# Patient Record
Sex: Female | Born: 1970
Health system: Southern US, Community
[De-identification: ages and names within clinical notes are randomized; demographics above are authoritative.]

---

## 2014-03-10 ENCOUNTER — Ambulatory Visit (INDEPENDENT_AMBULATORY_CARE_PROVIDER_SITE_OTHER): Payer: BLUE CROSS/BLUE SHIELD | Admitting: Sports Medicine

## 2014-03-10 ENCOUNTER — Encounter: Payer: Self-pay | Admitting: Sports Medicine

## 2014-03-10 ENCOUNTER — Other Ambulatory Visit: Payer: Self-pay | Admitting: Obstetrics and Gynecology

## 2014-03-10 ENCOUNTER — Ambulatory Visit (INDEPENDENT_AMBULATORY_CARE_PROVIDER_SITE_OTHER): Payer: BLUE CROSS/BLUE SHIELD

## 2014-03-10 VITALS — BP 166/105 | HR 97 | Ht 67.0 in | Wt 236.0 lb

## 2014-03-10 DIAGNOSIS — M5416 Radiculopathy, lumbar region: Secondary | ICD-10-CM | POA: Diagnosis not present

## 2014-03-10 DIAGNOSIS — Z1231 Encounter for screening mammogram for malignant neoplasm of breast: Secondary | ICD-10-CM

## 2014-03-10 DIAGNOSIS — M47816 Spondylosis without myelopathy or radiculopathy, lumbar region: Secondary | ICD-10-CM | POA: Insufficient documentation

## 2014-03-10 DIAGNOSIS — M4316 Spondylolisthesis, lumbar region: Secondary | ICD-10-CM

## 2014-03-10 DIAGNOSIS — M4806 Spinal stenosis, lumbar region: Secondary | ICD-10-CM

## 2014-03-10 MED ORDER — MELOXICAM 15 MG PO TABS: ORAL_TABLET | ORAL | Status: DC

## 2014-03-10 MED ORDER — PREDNISONE 50 MG PO TABS: ORAL_TABLET | ORAL | Status: DC

## 2014-03-10 MED ORDER — CYCLOBENZAPRINE HCL 10 MG PO TABS: ORAL_TABLET | ORAL | Status: DC

## 2014-03-10 NOTE — Progress Notes (Signed)
   Subjective:    I'm seeing this patient as a consultation for:  Dr. 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 It does not radiate to the feet. Pain is moderate, persistent, she is tearful describing her symptoms. Denies any bowel or bladder dysfunction or saddle numbness, no trauma. No constitutional symptoms.  Past medical history, Surgical history, Family history not pertinant except as noted below, Social history, Allergies, and medications have been entered into the medical record, reviewed, and no changes needed.   Review of Systems: No headache, visual changes, nausea, vomiting, diarrhea, constipation, dizziness, abdominal pain, skin rash, fevers, chills, night sweats, weight loss, swollen lymph nodes, body aches, joint swelling, muscle aches, chest pain, shortness of breath, mood changes, visual or auditory hallucinations.   Objective:   General: Well Developed, well nourished, and in no acute distress.  Neuro/Psych: Alert and oriented x3, extra-ocular muscles intact, able to move all 4 extremities, sensation grossly intact. Skin: Warm and dry, no rashes noted.  Respiratory: Not using accessory muscles, speaking in full sentences, trachea midline.  Cardiovascular: Pulses palpable, no extremity edema. Abdomen: Does not appear distended. Back Exam:  Inspection: Unremarkable  Motion: Flexion 45 deg, Extension 45 deg, Side Bending to 45 deg bilaterally,  Rotation to 45 deg bilaterally  SLR laying: Negative  XSLR laying: Negative  Palpable tenderness: None. FABER: negative. Sensory change: Gross sensation intact to all lumbar and sacral dermatomes.  Reflexes: 2+ at both patellar tendons, 2+ at achilles tendons, Babinski's downgoing.  Strength at foot    Plantar-flexion: 5/5 Dorsi-flexion: 5/5 Eversion: 5/5 Inversion: 5/5  Leg strength  Quad: 5/5 Hamstring: 5/5 Hip flexor: 5/5 Hip abductors: 5/5  Gait unremarkable.  X-rays personally reviewed and show L3 on L4 spondylolisthesis at L4 and L5 degenerative disc disease.  Impression and Recommendations:   This case required medical decision making of moderate complexity.

## 2014-03-10 NOTE — Assessment & Plan Note (Signed)
Left-sided L5 distribution. She does have some axial pain as well that is likely facet mediated. Prednisone, switched to meloxicam, Flexeril at bedtime, formal physical therapy, x-rays. Return in one month, MRI for interventional if no better. We also did discuss switching from Lexapro to an SNRI such as Cymbalta or Effexor, she will discuss this with her PCP

## 2014-03-10 NOTE — Patient Instructions (Signed)
We also did discuss switching from Lexapro to an SNRI such as Cymbalta or Effexor

## 2014-03-13 ENCOUNTER — Telehealth: Payer: Self-pay | Admitting: *Deleted

## 2014-03-13 MED ORDER — TRAMADOL HCL 50 MG PO TABS: ORAL_TABLET | ORAL | Status: DC

## 2014-03-13 NOTE — Telephone Encounter (Signed)
Pt left vm stating that the meloxicam & prednisone you gave her Monday aren't really helping & wanted to know if there is anything else she can take for her back discomfort.  Please advise.

## 2014-03-13 NOTE — Telephone Encounter (Signed)
Tramadol in box. 

## 2014-03-28 ENCOUNTER — Ambulatory Visit (INDEPENDENT_AMBULATORY_CARE_PROVIDER_SITE_OTHER): Payer: BLUE CROSS/BLUE SHIELD | Admitting: Physical Therapy

## 2014-03-28 DIAGNOSIS — M2560 Stiffness of unspecified joint, not elsewhere classified: Secondary | ICD-10-CM

## 2014-03-28 DIAGNOSIS — M5416 Radiculopathy, lumbar region: Secondary | ICD-10-CM

## 2014-03-28 DIAGNOSIS — M6281 Muscle weakness (generalized): Secondary | ICD-10-CM

## 2014-03-31 ENCOUNTER — Encounter: Payer: BLUE CROSS/BLUE SHIELD | Admitting: Physical Therapy

## 2014-04-09 ENCOUNTER — Ambulatory Visit (INDEPENDENT_AMBULATORY_CARE_PROVIDER_SITE_OTHER): Payer: BLUE CROSS/BLUE SHIELD

## 2014-04-09 DIAGNOSIS — Z1231 Encounter for screening mammogram for malignant neoplasm of breast: Secondary | ICD-10-CM

## 2014-04-11 ENCOUNTER — Encounter: Payer: Self-pay | Admitting: Sports Medicine

## 2014-04-11 ENCOUNTER — Ambulatory Visit (INDEPENDENT_AMBULATORY_CARE_PROVIDER_SITE_OTHER): Payer: BLUE CROSS/BLUE SHIELD | Admitting: Sports Medicine

## 2014-04-11 ENCOUNTER — Ambulatory Visit (INDEPENDENT_AMBULATORY_CARE_PROVIDER_SITE_OTHER): Payer: BLUE CROSS/BLUE SHIELD | Admitting: Physical Therapy

## 2014-04-11 DIAGNOSIS — M5416 Radiculopathy, lumbar region: Secondary | ICD-10-CM

## 2014-04-11 DIAGNOSIS — M2550 Pain in unspecified joint: Secondary | ICD-10-CM

## 2014-04-11 DIAGNOSIS — M6281 Muscle weakness (generalized): Secondary | ICD-10-CM

## 2014-04-11 MED ORDER — DULOXETINE HCL 30 MG PO CPEP
30.0000 mg | ORAL_CAPSULE | Freq: Every day | ORAL | Status: DC
Start: 2014-04-11 — End: 2014-05-12

## 2014-04-11 MED ORDER — TRAMADOL HCL 50 MG PO TABS: ORAL_TABLET | ORAL | Status: DC

## 2014-04-11 NOTE — Patient Instructions (Addendum)
Quads / HF, Lunge   Kneel in deep lunge, behind leg on floor. Push pelvis down slowly while slightly arching back until stretch is felt on front of hip. Hold _30__ seconds. Repeat _2__ times per session. Do __2_ sessions per day.  Copyright  VHI. All rights reserved.  Supine: Leg Stretch with Strap (Super Advanced)   Lie on back with one leg straight. Hook strap around other foot. Straighten knee. Raise leg to maximal stretch and straighten knee further by tightening quadriceps. Slowly press other leg down as close to floor as possible. Keep lower abdominals tight. Hold _15-30__ seconds. Warning: Intense stretch. Stay within tolerance. Repeat __2_ times per session. Do _1-2__ sessions per day.  Copyright  VHI. All rights reserved.  Piriformis Stretch, Supine   Lie supine, one ankle crossed onto opposite knee. Holding bottom leg behind knee, gently pull legs toward chest until stretch is felt in buttock of top leg. Hold 30___ seconds. For deeper stretch gently push top knee away from body.  Repeat _2__ times per session. Do _2__ sessions per day.  Copyright  VHI. All rights reserved.

## 2014-04-11 NOTE — Assessment & Plan Note (Signed)
50% improvement and axial pain, persistent left L5 radicular pain. She has only had 2 sessions of therapy, I think an additional month of physical therapy, and a switch from Lexapro to Cymbalta will be effective. Discontinue Flexeril, refilling tramadol. Cautioned about signs of serotonin syndrome. Return to see me in a month, MRI for interventional if no better.

## 2014-04-11 NOTE — Therapy (Signed)
[place], [place], [address] Phone: [phone]   Fax:  [phone]  Physical Therapy Treatment  Patient Details  Name: Brenda Church MRN: [id number] Date of Birth: 1971-02-05 Referring Provider:  Silverio Decamp,*  Encounter Date: 04/11/2014      PT End of Session - 04/11/14 0811    Visit Number 2   Number of Visits 6   Date for PT Re-Evaluation 05/09/14   PT Start Time 0808   PT Stop Time 0843   PT Time Calculation (min) 35 min   Activity Tolerance Patient tolerated treatment well      No past medical history on file.  No past surgical history on file.  LMP 03/15/2014  Visit Diagnosis:  Pain, joint, multiple sites - Plan: PT plan of care cert/re-cert  Generalized muscle weakness - Plan: PT plan of care cert/re-cert      Subjective Assessment - 04/11/14 0814    Symptoms Things have gotten 50% better.  Still, by end of day pain down L leg (to ant    Limitations Walking   Currently in Pain? No/denies  by end of day up to 9-10/10. Burning pain down ant L thigh and shin          [facility] Adult PT Treatment/Exercise - 04/11/14 0001    Lumbar Exercises: Stretches   Active Hamstring Stretch Limitations --  Cross body stretch R/L. (Tight lateral R/L thigh)    Hip Flexor Stretch 2 reps;30 seconds  each side kneeling (pillow under knee)   Quad Stretch 2 reps;30 seconds   ITB Stretch --  (Cross body stretch performed)   Piriformis Stretch 2 reps;30 seconds  attempted supine. Pt preferred seated, each side performed.    Lumbar Exercises: Aerobic   Stationary Bike NuStep Level 4 x 5 min    Lumbar Exercises: Supine   Other Supine Lumbar Exercises TA contraction  marching x 15, heel slides x 10 each leg    Knee/Hip Exercises: Sidelying   Clams --  2 x10 each side.            PT Education - 04/11/14 0949    Education provided Yes   Education Details HEP - hip flexor, cross  body stretch, and piriformis stretches    Person(s) Educated Patient   Methods Explanation;Demonstration   Comprehension Verbalized understanding;Returned demonstration           PT Long Term Goals - 04/11/14 0955    PT LONG TERM GOAL #1   Title Demonstrate and/or verbalize techniques to reduce the risk of re-injury to include information on body mechanics and posture.    Time 6   Period Weeks   Status On-going   PT LONG TERM GOAL #2   Title Be independent with advance HEP   Time 6   Period Weeks   Status On-going   PT LONG TERM GOAL #3   Title Report pain decrease to allow her to return to normal ADLs    Time 6   Period Weeks   Status On-going   PT LONG TERM GOAL #4   Title Perform core work with good contraction of TA and multifidus    Time 6   Period Weeks   Status Partially Met   PT LONG TERM GOAL #5   Title Improve FOTO to =/<37% limited   Time 6   Period Weeks   Status On-going   Additional Long Term Goals   Additional Long Term Goals  Yes   PT LONG TERM GOAL #6   Title Work per her previous level   Time 6   Period Weeks   Status On-going           Plan - 04/11/14 0950    Clinical Impression Statement Pt making progress towards goals with reduction in overall pain and improved TA contraction.  LTG # 4 partially met.  Pt tolerated new stretches without increase in symptoms.  Pt very point tender to touch in glute medius and prirformis ; may benefit from  manual therapy next session.  Pt motivated to progress; interested in increasing frequency in order to achieve goals.    PT Frequency 2x / week  Patient initially had requested every other week due to high deductible however now wishes to come more frequently. Will change POC to 2x/wk    PT Duration 6 weeks   PT Treatment/Interventions Cryotherapy;Moist Heat;Traction;Ultrasound;Therapeutic exercise;Patient/family education;Manual techniques;Electrical Stimulation   PT Next Visit Plan Trial traction and  manual therapy next visit. Discusssed with supervising PT regarding therapy frequency.    Consulted and Agree with Plan of Care Patient        Problem List Patient Active Problem List   Diagnosis Date Noted  . Left lumbar radiculopathy 03/10/2014    Kerin Perna, PTA 04/11/2014 10:55 AM  [facility] [place], [place], [address] Phone: [phone]   Fax:  [phone]

## 2014-04-11 NOTE — Progress Notes (Signed)
  Subjective:    CC: Follow-up  HPI: Left lumbar radiculopathy:, Clinical left L5, has only had 2 sessions of physical therapy and notes 50% improvement in her axial pain but persistent left-sided radicular pain. She does desire more physical therapy understandably. She has continued Lexapro and I had asked her to discuss this with her PCP, switching to Cymbalta. She did, and her PCP would like me to do the switch. Flexeril has not been effective, tramadol has been. No bowel or bladder dysfunction or saddle numbness.  Past medical history, Surgical history, Family history not pertinant except as noted below, Social history, Allergies, and medications have been entered into the medical record, reviewed, and no changes needed.   Review of Systems: No fevers, chills, night sweats, weight loss, chest pain, or shortness of breath.   Objective:    General: Well Developed, well nourished, and in no acute distress.  Neuro: Alert and oriented x3, extra-ocular muscles intact, sensation grossly intact.  HEENT: Normocephalic, atraumatic, pupils equal round reactive to light, neck supple, no masses, no lymphadenopathy, thyroid nonpalpable.  Skin: Warm and dry, no rashes. Cardiac: Regular rate and rhythm, no murmurs rubs or gallops, no lower extremity edema.  Respiratory: Clear to auscultation bilaterally. Not using accessory muscles, speaking in full sentences.  Impression and Recommendations:

## 2014-04-11 NOTE — Patient Instructions (Signed)
Cut Lexapro in half for one week then stop, start low-dose Cymbalta tomorrow.

## 2014-04-21 ENCOUNTER — Ambulatory Visit (INDEPENDENT_AMBULATORY_CARE_PROVIDER_SITE_OTHER): Payer: BLUE CROSS/BLUE SHIELD | Admitting: Physical Therapy

## 2014-04-21 DIAGNOSIS — M6281 Muscle weakness (generalized): Secondary | ICD-10-CM

## 2014-04-21 DIAGNOSIS — M2550 Pain in unspecified joint: Secondary | ICD-10-CM

## 2014-04-21 NOTE — Patient Instructions (Signed)
Chair Pose   Stand with legs hip-width apart. Inhaling, bend knees trying to keep knees over ankles, as if to sit on a chair and extend arms in front, wrists slightly lower than shoulders. Hold position for 2 sec.___ breaths. Exhaling, stand up. Repeat __10 each side with ball squeeze then Abd with green band_ times. Do _2__ times per day.  Copyright  VHI. All rights reserved.  Chair Pose   Stand with legs hip-width apart. Inhaling, bend knees trying to keep knees over ankles, as if to sit on a chair and extend arms in front, wrists slightly lower than shoulders. Hold position for ___ breaths. Exhaling, stand up. Repeat ___ times. Do ___ times per day.  Copyright  VHI. All rights reserved.

## 2014-04-21 NOTE — Therapy (Signed)
[place], [place], [address] Phone: [phone]   Fax:  [phone]  Physical Therapy Treatment  Patient Details  Name: Brenda Church MRN: [id number] Date of Birth: 1970/09/03 Referring Provider:  Silverio Decamp,*  Encounter Date: 04/21/2014      PT End of Session - 04/21/14 0749    Visit Number 3   Number of Visits 6   Date for PT Re-Evaluation 05/09/14   PT Start Time 0700   PT Stop Time 3343   PT Time Calculation (min) 55 min   Equipment Utilized During Treatment Other (comment)      No past medical history on file.  No past surgical history on file.  LMP 03/15/2014  Visit Diagnosis:  Generalized muscle weakness  Pain, joint, multiple sites      Subjective Assessment - 04/21/14 0704    Symptoms a little better. overall pain down Lt leg. sharpe.   Currently in Pain? No/denies  never in the mornings                    [facility] Adult PT Treatment/Exercise - 04/21/14 0001    Lumbar Exercises: Stretches   Hip Flexor Stretch 2 reps;30 seconds   Prone on Elbows Stretch 5 reps;10 seconds   Quad Stretch 2 reps;30 seconds   Lumbar Exercises: Standing   Other Standing Lumbar Exercises 10 reps mini lunges  with ball squeeze for pelvic floor/core   Other Standing Lumbar Exercises 10 reps X2  green band around thighs push out   Lumbar Exercises: Prone   Opposite Arm/Leg Raise 10 reps   Opposite Arm/Leg Raise Limitations 10 reps   Modalities   Modalities Traction  pt weighs 240#'s   Traction   Type of Traction Lumbar   Min (lbs) 10#   Max (lbs) 40#  changed from 50# due to pressure in sacrum   Hold Time 60sec   Rest Time 20sec   Time 15 min                PT Education - 04/21/14 0748    Education provided Yes   Person(s) Educated Patient   Methods Explanation;Demonstration;Verbal cues;Handout   Comprehension Verbalized understanding;Returned  demonstration             PT Long Term Goals - 04/11/14 0955    PT LONG TERM GOAL #1   Title Demonstrate and/or verbalize techniques to reduce the risk of re-injury to include information on body mechanics and posture.    Time 6   Period Weeks   Status On-going   PT LONG TERM GOAL #2   Title Be independent with advance HEP   Time 6   Period Weeks   Status On-going   PT LONG TERM GOAL #3   Title Report pain decrease to allow her to return to normal ADLs    Time 6   Period Weeks   Status On-going   PT LONG TERM GOAL #4   Title Perform core work with good contraction of TA and multifidus    Time 6   Period Weeks   Status Partially Met   PT LONG TERM GOAL #5   Title Improve FOTO to =/<37% limited   Time 6   Period Weeks   Status On-going   Additional Long Term Goals   Additional Long Term Goals Yes   PT LONG TERM GOAL #6   Title Work per her previous level   Time 6  Period Weeks   Status On-going               Plan - 04/21/14 1991    PT Next Visit Plan continue with core / spinal stab exercises   PT Home Exercise Plan will continue with her HEP and her own core exercise which are great   Consulted and Agree with Plan of Care Patient        Problem List Patient Active Problem List   Diagnosis Date Noted  . Left lumbar radiculopathy 03/10/2014    Natividad Brood 04/21/2014, 7:57 AM  [facility] [place], [place], [address] Phone: [phone]   Fax:  [phone]

## 2014-04-28 ENCOUNTER — Encounter: Payer: Self-pay | Admitting: Physical Therapy

## 2014-04-28 ENCOUNTER — Ambulatory Visit (INDEPENDENT_AMBULATORY_CARE_PROVIDER_SITE_OTHER): Payer: BLUE CROSS/BLUE SHIELD | Admitting: Physical Therapy

## 2014-04-28 DIAGNOSIS — M6281 Muscle weakness (generalized): Secondary | ICD-10-CM

## 2014-04-28 DIAGNOSIS — M2550 Pain in unspecified joint: Secondary | ICD-10-CM

## 2014-04-28 NOTE — Therapy (Addendum)
[place], [place], [address] Phone: [phone]   Fax:  [phone]  Physical Therapy Treatment  Patient Details  Name: Brenda Church MRN: [id number] Date of Birth: 1970/05/21 Referring Provider:  Silverio Decamp,*  Encounter Date: 04/28/2014      PT End of Session - 04/28/14 0831    Visit Number 4   Number of Visits 6   Date for PT Re-Evaluation 05/09/14   PT Start Time 0759   PT Stop Time 0846   PT Time Calculation (min) 47 min      History reviewed. No pertinent past medical history.  History reviewed. No pertinent past surgical history.  LMP 03/15/2014  Visit Diagnosis:  Generalized muscle weakness  Pain, joint, multiple sites      Subjective Assessment - 04/28/14 0802    Symptoms was able to do some light housework this weekend, happy she was able to do it but sore now. pt felt better after traction                    [facility] Adult PT Treatment/Exercise - 04/28/14 0001    Lumbar Exercises: Stretches   Prone on Elbows Stretch 5 reps;10 seconds   Quad Stretch 2 reps;30 seconds   Piriformis Stretch 2 reps;30 seconds  added anterior tib stretch while she is stretching piriformi   Lumbar Exercises: Aerobic   Stationary Bike nu step level 4 x 5 minutes   Lumbar Exercises: Supine   Bridge 10 reps;Other (comment)  10 reps normal, 10 x with ball squeeze, 10x with alt LE ext   Lumbar Exercises: Prone   Opposite Arm/Leg Raise 10 reps   Lumbar Exercises: Quadruped   Straight Leg Raise 10 reps   Opposite Arm/Leg Raise Right arm/Left leg;Left arm/Right leg;10 reps   Knee/Hip Exercises: Stretches   Gastroc Stretch 2 reps;30 seconds  off of step   Modalities   Modalities Traction   Traction   Type of Traction Lumbar   Min (lbs) 10#   Max (lbs) 40#   Hold Time 60   Rest Time 20   Time 15 min                     PT Long Term Goals - 04/28/14 0102    PT LONG TERM GOAL #1   Title Demonstrate and/or verbalize techniques to reduce the risk of re-injury to include information on body mechanics and posture.    Time 6   Period Weeks   Status On-going   PT LONG TERM GOAL #2   Title Be independent with advance HEP   Time 6   Period Weeks   Status On-going   PT LONG TERM GOAL #3   Title Report pain decrease to allow her to return to normal ADLs    Time 6   Period Weeks   Status On-going   PT LONG TERM GOAL #4   Title Perform core work with good contraction of TA and multifidus    Time 6   Period Weeks   Status Partially Met   PT LONG TERM GOAL #5   Title Improve FOTO to =/<37% limited   Time 6   Period Weeks   Status On-going   PT LONG TERM GOAL #6   Title Work per her previous level   Time 6   Period Weeks   Status On-going  Plan - 04/28/14 1959    Clinical Impression Statement pt does not like squats, added quadruped exercises to continue to strengthen core. pt very compliant with HEP, she likes traction   PT Frequency 2x / week   PT Duration 6 weeks   PT Next Visit Plan continue with core / spinal stab exercises   Consulted and Agree with Plan of Care Patient        Problem List Patient Active Problem List   Diagnosis Date Noted  . Left lumbar radiculopathy 03/10/2014   Isabelle Course, PT, DPT 04/28/2014, 8:36 AM  [facility] [place], [place], [address] Phone: [phone]   Fax:  [phone]     PHYSICAL THERAPY DISCHARGE SUMMARY  Visits from Start of Care: 4  Current functional level related to goals / functional outcomes: Unable to assess as patient did not return for f/u visits.   Remaining deficits: unknown   Education / Equipment: HEP   Plan: Patient agrees to discharge.  Patient goals were partially met. Patient is being discharged due to not returning since the last visit.  ?????       Madelyn Flavors, PT 06/09/2014 9:29 AM  [facility] [place], [place] [address]  [phone] (office) [phone] (fax)

## 2014-05-12 ENCOUNTER — Encounter: Payer: Self-pay | Admitting: Sports Medicine

## 2014-05-12 ENCOUNTER — Ambulatory Visit (INDEPENDENT_AMBULATORY_CARE_PROVIDER_SITE_OTHER): Payer: BLUE CROSS/BLUE SHIELD | Admitting: Sports Medicine

## 2014-05-12 VITALS — BP 160/93 | HR 92 | Ht 67.0 in | Wt 240.0 lb

## 2014-05-12 DIAGNOSIS — M5416 Radiculopathy, lumbar region: Secondary | ICD-10-CM

## 2014-05-12 MED ORDER — GABAPENTIN 300 MG PO CAPS: ORAL_CAPSULE | ORAL | Status: DC

## 2014-05-12 MED ORDER — DULOXETINE HCL 60 MG PO CPEP: 60.0000 mg | ORAL_CAPSULE | Freq: Every day | ORAL | Status: DC

## 2014-05-12 NOTE — Assessment & Plan Note (Signed)
Has now had 4 weeks of physical therapy, back pain is almost gone persistent left L5 radiculopathy. Does not want to proceed to MRI or intervention due to financial reasons. We will add gabapentin and increase Cymbalta to 60 mg. Return in one month.

## 2014-05-12 NOTE — Progress Notes (Signed)
  Subjective:    CC: Follow-up  HPI: French Anaracy returns, she has left-sided L5 radiculopathy, she's done well with formal physical therapy with near complete resolution of her axial back pain but persistent left-sided L5 paresthesias. She has also done well with switching from Celexa to Cymbalta, at this juncture we would typically be proceeding with MRI for interventional planning however due to financial reasons and $11,000 deductible she would like to proceed a bit further with oral medication management before going to MRI and/or interventional injection procedure. Symptoms are mild, improving.  Past medical history, Surgical history, Family history not pertinant except as noted below, Social history, Allergies, and medications have been entered into the medical record, reviewed, and no changes needed.   Review of Systems: No fevers, chills, night sweats, weight loss, chest pain, or shortness of breath.   Objective:    General: Well Developed, well nourished, and in no acute distress.  Neuro: Alert and oriented x3, extra-ocular muscles intact, sensation grossly intact.  HEENT: Normocephalic, atraumatic, pupils equal round reactive to light, neck supple, no masses, no lymphadenopathy, thyroid nonpalpable.  Skin: Warm and dry, no rashes. Cardiac: Regular rate and rhythm, no murmurs rubs or gallops, no lower extremity edema.  Respiratory: Clear to auscultation bilaterally. Not using accessory muscles, speaking in full sentences.  Impression and Recommendations:

## 2014-06-20 ENCOUNTER — Ambulatory Visit: Payer: BLUE CROSS/BLUE SHIELD | Admitting: Sports Medicine

## 2014-06-26 ENCOUNTER — Ambulatory Visit (INDEPENDENT_AMBULATORY_CARE_PROVIDER_SITE_OTHER): Payer: BLUE CROSS/BLUE SHIELD | Admitting: Sports Medicine

## 2014-06-26 ENCOUNTER — Encounter: Payer: Self-pay | Admitting: Sports Medicine

## 2014-06-26 VITALS — BP 158/100 | HR 79 | Ht 67.0 in | Wt 235.0 lb

## 2014-06-26 DIAGNOSIS — M5416 Radiculopathy, lumbar region: Secondary | ICD-10-CM

## 2014-06-26 MED ORDER — TRAMADOL HCL 50 MG PO TABS: ORAL_TABLET | ORAL | Status: DC

## 2014-06-26 NOTE — Progress Notes (Signed)
  Subjective:    CC: follow-up  HPI: Left lumbar radiculopathy: Overall doing well, she is only using Cymbalta 60, ibuprofen 800 at night, Celebrex in the morning,and an occasional tramadol. Happy with results so far, was unable to function on gabapentin,happy with results so far and no further treatment needed.  Past medical history, Surgical history, Family history not pertinant except as noted below, Social history, Allergies, and medications have been entered into the medical record, reviewed, and no changes needed.   Review of Systems: No fevers, chills, night sweats, weight loss, chest pain, or shortness of breath.   Objective:    General: Well Developed, well nourished, and in no acute distress.  Neuro: Alert and oriented x3, extra-ocular muscles intact, sensation grossly intact.  HEENT: Normocephalic, atraumatic, pupils equal round reactive to light, neck supple, no masses, no lymphadenopathy, thyroid nonpalpable.  Skin: Warm and dry, no rashes. Cardiac: Regular rate and rhythm, no murmurs rubs or gallops, no lower extremity edema.  Respiratory: Clear to auscultation bilaterally. Not using accessory muscles, speaking in full sentences.  Impression and Recommendations:

## 2014-06-26 NOTE — Assessment & Plan Note (Signed)
Overall doing significantly better with essential resolution of radiculopathy on Cymbalta 60, occasional tramadol and occasional ibuprofen, she is also doing Celebrex in the mornings. Refilling tramadol, return as needed.

## 2014-08-18 ENCOUNTER — Other Ambulatory Visit: Payer: Self-pay | Admitting: Sports Medicine

## 2015-01-16 ENCOUNTER — Other Ambulatory Visit: Payer: Self-pay | Admitting: Sports Medicine

## 2018-04-06 ENCOUNTER — Encounter: Payer: Self-pay | Admitting: Sports Medicine

## 2018-04-06 ENCOUNTER — Other Ambulatory Visit: Payer: Self-pay | Admitting: Sports Medicine

## 2018-04-06 ENCOUNTER — Ambulatory Visit: Payer: BLUE CROSS/BLUE SHIELD | Admitting: Sports Medicine

## 2018-04-06 ENCOUNTER — Ambulatory Visit (INDEPENDENT_AMBULATORY_CARE_PROVIDER_SITE_OTHER): Payer: BLUE CROSS/BLUE SHIELD

## 2018-04-06 DIAGNOSIS — M5416 Radiculopathy, lumbar region: Secondary | ICD-10-CM

## 2018-04-06 DIAGNOSIS — M5117 Intervertebral disc disorders with radiculopathy, lumbosacral region: Secondary | ICD-10-CM | POA: Diagnosis not present

## 2018-04-06 MED ORDER — PREDNISONE 50 MG PO TABS: ORAL_TABLET | ORAL | 0 refills | Status: DC

## 2018-04-06 MED ORDER — TRAMADOL HCL 50 MG PO TABS: ORAL_TABLET | ORAL | 0 refills | Status: DC

## 2018-04-06 MED ORDER — DULOXETINE HCL 30 MG PO CPEP: 30.0000 mg | ORAL_CAPSULE | Freq: Every day | ORAL | 3 refills | Status: DC

## 2018-04-06 NOTE — Assessment & Plan Note (Signed)
Pain L4 distribution radiculopathy. Adding prednisone, down taper Lexapro and restarting Cymbalta. She will do home rehab exercises. She is having some bowel and bladder dysfunction so we are going to go ahead and proceed with MRI. Return to see me in 3 to 4 weeks.

## 2018-04-06 NOTE — Patient Instructions (Signed)
Decrease Lexapro to 10 mg daily for 1 week then 5 mg daily for 1 week then stop. You may start Cymbalta tomorrow.

## 2018-04-06 NOTE — Progress Notes (Signed)
Subjective:    CC: Low back pain  HPI:  Brenda Church is a pleasant 48 year old female, I last saw her about 4 years ago.  We treated her for left lumbar radiculopathy, she did well with conservative measures and Cymbalta.  Since then she has, Cymbalta, she was placed on Lexapro by her PCP which had good effect for her mood disorder.  Unfortunately over the past year she has had worsening low back pain with radiation down the right leg, to the anterior thigh, to the big toe.  Unfortunately she is also endorsing some bladder dysfunction, with leaking in her pants, mostly during Valsalva.  She does have some weakness of the right leg.  Symptoms are moderate, persistent.  I reviewed the past medical history, family history, social history, surgical history, and allergies today and no changes were needed.  Please see the problem list section below in epic for further details.  Past Medical History: No past medical history on file. Past Surgical History: No past surgical history on file. Social History: Social History   Socioeconomic History  . Marital status: Married    Spouse name: Not on file  . Number of children: Not on file  . Years of education: Not on file  . Highest education level: Not on file  Occupational History  . Not on file  Social Needs  . Financial resource strain: Not on file  . Food insecurity:    Worry: Not on file    Inability: Not on file  . Transportation needs:    Medical: Not on file    Non-medical: Not on file  Tobacco Use  . Smoking status: Never Smoker  . Smokeless tobacco: Never Used  Substance and Sexual Activity  . Alcohol use: Not on file  . Drug use: Not on file  . Sexual activity: Not on file  Lifestyle  . Physical activity:    Days per week: Not on file    Minutes per session: Not on file  . Stress: Not on file  Relationships  . Social connections:    Talks on phone: Not on file    Gets together: Not on file    Attends religious service: Not on  file    Active member of club or organization: Not on file    Attends meetings of clubs or organizations: Not on file    Relationship status: Not on file  Other Topics Concern  . Not on file  Social History Narrative  . Not on file   Family History: No family history on file. Allergies: Allergies  Allergen Reactions  . Vicodin [Hydrocodone-Acetaminophen]    Medications: See med rec.  Review of Systems: No headache, visual changes, nausea, vomiting, diarrhea, constipation, dizziness, abdominal pain, skin rash, fevers, chills, night sweats, swollen lymph nodes, weight loss, chest pain, body aches, joint swelling, muscle aches, shortness of breath, mood changes, visual or auditory hallucinations.  Objective:    General: Well Developed, well nourished, and in no acute distress.  Neuro: Alert and oriented x3, extra-ocular muscles intact, sensation grossly intact.  HEENT: Normocephalic, atraumatic, pupils equal round reactive to light, neck supple, no masses, no lymphadenopathy, thyroid nonpalpable.  Skin: Warm and dry, no rashes noted.  Cardiac: Regular rate and rhythm, no murmurs rubs or gallops.  Respiratory: Clear to auscultation bilaterally. Not using accessory muscles, speaking in full sentences.  Abdominal: Soft, nontender, nondistended, positive bowel sounds, no masses, no organomegaly.  Back Exam:  Inspection: Unremarkable  Motion: Flexion 45 deg, Extension 45  deg, Side Bending to 45 deg bilaterally,  Rotation to 45 deg bilaterally  SLR laying: Negative  XSLR laying: Negative  Palpable tenderness: None. FABER: negative. Sensory change: Gross sensation intact to all lumbar and sacral dermatomes.  Reflexes: 2+ at both patellar tendons, 2+ at achilles tendons, Babinski's downgoing.  Strength at foot  Plantar-flexion: 5/5 Dorsi-flexion: 5/5 Eversion: 5/5 Inversion: 5/5  Leg strength  Quad: 5/5 Hamstring: 5/5 Hip flexor: 5/5 Hip abductors: 5/5  Gait  unremarkable.  Impression and Recommendations:    The patient was counselled, risk factors were discussed, anticipatory guidance given.  Right lumbar radiculopathy Pain L4 distribution radiculopathy. Adding prednisone, down taper Lexapro and restarting Cymbalta. She will do home rehab exercises. She is having some bowel and bladder dysfunction so we are going to go ahead and proceed with MRI. Return to see me in 3 to 4 weeks. ___________________________________________ Ihor Austin. Benjamin Stain, M.D., ABFM., CAQSM. Primary Care and Sports Medicine [facility]  Adjunct Professor of Family Medicine  [employer] [facility] [employer] [facility] [facility] [employer]

## 2018-04-28 ENCOUNTER — Other Ambulatory Visit: Payer: Self-pay | Admitting: Sports Medicine

## 2018-04-28 DIAGNOSIS — M5416 Radiculopathy, lumbar region: Secondary | ICD-10-CM

## 2018-05-07 ENCOUNTER — Ambulatory Visit (INDEPENDENT_AMBULATORY_CARE_PROVIDER_SITE_OTHER): Payer: BLUE CROSS/BLUE SHIELD | Admitting: Sports Medicine

## 2018-05-07 ENCOUNTER — Other Ambulatory Visit: Payer: Self-pay

## 2018-05-07 ENCOUNTER — Encounter: Payer: Self-pay | Admitting: Sports Medicine

## 2018-05-07 DIAGNOSIS — M5416 Radiculopathy, lumbar region: Secondary | ICD-10-CM

## 2018-05-07 MED ORDER — DULOXETINE HCL 60 MG PO CPEP: 60.0000 mg | ORAL_CAPSULE | Freq: Every day | ORAL | 4 refills | Status: DC

## 2018-05-07 NOTE — Assessment & Plan Note (Signed)
Right L4 distribution. Did better with prednisone but now having an increase in pain. I down taper her Lexapro and restarting Cymbalta, increasing this to 60 mg. Never got the MRI. Continue meloxicam, I can refill tramadol as needed. Return to see me in a couple of months.

## 2018-05-07 NOTE — Progress Notes (Signed)
Subjective:    CC: Follow-up  HPI: This is a pleasant 48 year old female, we have been treating her for right L4 radiculopathy.  She did well a long time ago with Cymbalta, she was then switched to Lexapro and her radicular pain came back.  We down taper Lexapro and switched her back to Cymbalta and she has improved to some degree.  She has a bit of aching, meloxicam as well as an occasional Tylenol and tramadol provide some relief.  She is only taking 30 mg of Cymbalta now.  No current bowel or bladder dysfunction, saddle numbness, no constitutional symptoms, no trauma.  Pain is mild, persistent.  I reviewed the past medical history, family history, social history, surgical history, and allergies today and no changes were needed.  Please see the problem list section below in epic for further details.  Past Medical History: No past medical history on file. Past Surgical History: No past surgical history on file. Social History: Social History   Socioeconomic History  . Marital status: Married    Spouse name: Not on file  . Number of children: Not on file  . Years of education: Not on file  . Highest education level: Not on file  Occupational History  . Not on file  Social Needs  . Financial resource strain: Not on file  . Food insecurity:    Worry: Not on file    Inability: Not on file  . Transportation needs:    Medical: Not on file    Non-medical: Not on file  Tobacco Use  . Smoking status: Never Smoker  . Smokeless tobacco: Never Used  Substance and Sexual Activity  . Alcohol use: Not on file  . Drug use: Not on file  . Sexual activity: Not on file  Lifestyle  . Physical activity:    Days per week: Not on file    Minutes per session: Not on file  . Stress: Not on file  Relationships  . Social connections:    Talks on phone: Not on file    Gets together: Not on file    Attends religious service: Not on file    Active member of club or organization: Not on file   Attends meetings of clubs or organizations: Not on file    Relationship status: Not on file  Other Topics Concern  . Not on file  Social History Narrative  . Not on file   Family History: No family history on file. Allergies: Allergies  Allergen Reactions  . Vicodin [Hydrocodone-Acetaminophen]    Medications: See med rec.  Review of Systems: No fevers, chills, night sweats, weight loss, chest pain, or shortness of breath.   Objective:    General: Well Developed, well nourished, and in no acute distress.  Neuro: Alert and oriented x3, extra-ocular muscles intact, sensation grossly intact.  HEENT: Normocephalic, atraumatic, pupils equal round reactive to light, neck supple, no masses, no lymphadenopathy, thyroid nonpalpable.  Skin: Warm and dry, no rashes. Cardiac: Regular rate and rhythm, no murmurs rubs or gallops, no lower extremity edema.  Respiratory: Clear to auscultation bilaterally. Not using accessory muscles, speaking in full sentences.  Impression and Recommendations:    Right lumbar radiculopathy Right L4 distribution. Did better with prednisone but now having an increase in pain. I down taper her Lexapro and restarting Cymbalta, increasing this to 60 mg. Never got the MRI. Continue meloxicam, I can refill tramadol as needed. Return to see me in a couple of months.  I spent  25 minutes with this patient, greater than 50% was face-to-face time counseling regarding the above diagnoses, specifically discussing the diagnostic process, treatment options, and prognosis.  ___________________________________________ Ihor Austin. Benjamin Stain, M.D., ABFM., CAQSM. Primary Care and Sports Medicine [facility]  Adjunct Professor of Family Medicine  [employer] [facility] [employer]

## 2018-05-19 ENCOUNTER — Other Ambulatory Visit: Payer: Self-pay | Admitting: Sports Medicine

## 2018-05-19 DIAGNOSIS — M5416 Radiculopathy, lumbar region: Secondary | ICD-10-CM

## 2018-06-25 ENCOUNTER — Ambulatory Visit: Payer: BLUE CROSS/BLUE SHIELD | Admitting: Sports Medicine

## 2018-06-25 ENCOUNTER — Encounter: Payer: Self-pay | Admitting: Sports Medicine

## 2018-06-25 ENCOUNTER — Ambulatory Visit (INDEPENDENT_AMBULATORY_CARE_PROVIDER_SITE_OTHER): Payer: BLUE CROSS/BLUE SHIELD

## 2018-06-25 ENCOUNTER — Other Ambulatory Visit: Payer: Self-pay

## 2018-06-25 DIAGNOSIS — M5416 Radiculopathy, lumbar region: Secondary | ICD-10-CM

## 2018-06-25 NOTE — Assessment & Plan Note (Addendum)
Persistent right L4 radiculopathy with axial discogenic back pain on the right side. MRI at 11:15 today. This is for interventional planning.  L3-L4 central canal stenosis, multilevel DDD, ordering a right L3-L4 interlaminar epidural, we can target the facets if this does not provide good relief.

## 2018-06-25 NOTE — Addendum Note (Signed)
Addended by: Monica Becton on: 06/25/2018 01:47 PM   Modules accepted: Orders

## 2018-06-25 NOTE — Patient Instructions (Signed)
MRI at 11:15 today.

## 2018-06-25 NOTE — Progress Notes (Addendum)
  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  I reviewed the past medical history, family history, social history, surgical history, and allergies today and no changes were needed.  Please see the problem list section below in epic for further details.  Past Medical History: No past medical history on file. Past Surgical History: No past surgical history on file. Social History: Social History   Socioeconomic History  . Marital status: Married    Spouse name: Not on file  . Number of children: Not on file  . Years of education: Not on file  . Highest education level: Not on file  Occupational History  . Not on file  Social Needs  . Financial resource strain: Not on file  . Food insecurity:    Worry: Not on file    Inability: Not on file  . Transportation needs:    Medical: Not on file    Non-medical: Not on file  Tobacco Use  . Smoking status: Never Smoker  . Smokeless tobacco: Never Used  Substance and Sexual Activity  . Alcohol use: Not on file  . Drug use: Not on file  . Sexual activity: Not on file  Lifestyle  . Physical activity:    Days per week: Not on file    Minutes per session: Not on file  . Stress: Not on file  Relationships  . Social connections:    Talks on phone: Not on file    Gets together: Not on file    Attends religious service: Not on file    Active member of club or organization: Not on file    Attends meetings of clubs or organizations: Not on file    Relationship status: Not on file  Other Topics Concern  . Not on file  Social History Narrative  . Not on file   Family History: No family history on file. Allergies: Allergies  Allergen Reactions  . Vicodin [Hydrocodone-Acetaminophen]    Medications: See med rec.  Review of  Systems: No fevers, chills, night sweats, weight loss, chest pain, or shortness of breath.   Objective:    General: Well Developed, well nourished, and in no acute distress.  Neuro: Alert and oriented x3, extra-ocular muscles intact, sensation grossly intact.  HEENT: Normocephalic, atraumatic, pupils equal round reactive to light, neck supple, no masses, no lymphadenopathy, thyroid nonpalpable.  Skin: Warm and dry, no rashes. Cardiac: Regular rate and rhythm, no murmurs rubs or gallops, no lower extremity edema.  Respiratory: Clear to auscultation bilaterally. Not using accessory muscles, speaking in full sentences.  Impression and Recommendations:    Right lumbar radiculopathy Persistent right L4 radiculopathy with axial discogenic back pain on the right side. MRI at 11:15 today. This is for interventional planning.  L3-L4 central canal stenosis, multilevel DDD, ordering a right L3-L4 interlaminar epidural, we can target the facets if this does not provide good relief.   ___________________________________________ Ihor Austin. Benjamin Stain, M.D., ABFM., CAQSM. Primary Care and Sports Medicine [facility]  Adjunct Professor of Family Medicine  [employer] [facility] [employer]

## 2018-07-23 ENCOUNTER — Encounter: Payer: Self-pay | Admitting: Sports Medicine

## 2018-07-23 ENCOUNTER — Ambulatory Visit: Payer: BLUE CROSS/BLUE SHIELD | Admitting: Sports Medicine

## 2018-07-23 DIAGNOSIS — M47816 Spondylosis without myelopathy or radiculopathy, lumbar region: Secondary | ICD-10-CM

## 2018-07-23 NOTE — Progress Notes (Signed)
Subjective:    CC: Recheck back pain  HPI: Brenda Church returns, she is a pleasant 48 year old female with chronic low back pain, she has L3-L4 spinal stenosis as well as facet degenerative changes, right-sided low back pain with radiation into the right buttock and thigh, she did not respond to a right L3-L4 transforaminal epidural.  I reviewed the past medical history, family history, social history, surgical history, and allergies today and no changes were needed.  Please see the problem list section below in epic for further details.  Past Medical History: No past medical history on file. Past Surgical History: No past surgical history on file. Social History: Social History   Socioeconomic History  . Marital status: Married    Spouse name: Not on file  . Number of children: Not on file  . Years of education: Not on file  . Highest education level: Not on file  Occupational History  . Not on file  Social Needs  . Financial resource strain: Not on file  . Food insecurity:    Worry: Not on file    Inability: Not on file  . Transportation needs:    Medical: Not on file    Non-medical: Not on file  Tobacco Use  . Smoking status: Never Smoker  . Smokeless tobacco: Never Used  Substance and Sexual Activity  . Alcohol use: Not on file  . Drug use: Not on file  . Sexual activity: Not on file  Lifestyle  . Physical activity:    Days per week: Not on file    Minutes per session: Not on file  . Stress: Not on file  Relationships  . Social connections:    Talks on phone: Not on file    Gets together: Not on file    Attends religious service: Not on file    Active member of club or organization: Not on file    Attends meetings of clubs or organizations: Not on file    Relationship status: Not on file  Other Topics Concern  . Not on file  Social History Narrative  . Not on file   Family History: No family history on file.  Health Allergies: Allergies  Allergen Reactions   . Vicodin [Hydrocodone-Acetaminophen]    Medications: See med rec.  Review of Systems: No fevers, chills, night sweats, weight loss, chest pain, or shortness of breath.   Objective:    General: Well Developed, well nourished, and in no acute distress.  Neuro: Alert and oriented x3, extra-ocular muscles intact, sensation grossly intact.  HEENT: Normocephalic, atraumatic, pupils equal round reactive to light, neck supple, no masses, no lymphadenopathy, thyroid nonpalpable.  Skin: Warm and dry, no rashes. Cardiac: Regular rate and rhythm, no murmurs rubs or gallops, no lower extremity edema.  Respiratory: Clear to auscultation bilaterally. Not using accessory muscles, speaking in full sentences.  Impression and Recommendations:    Lumbar spondylosis Right-sided low back pain, she does have L3-L4 central canal stenosis with milder degenerative disc disease at other levels, did not respond to her right L3-L4 epidural, transforaminal approach. Next step will be to target the right L3-L4 and L4-L5 facets, these facets have the greatest degree of osteoarthritis. If this works then we will proceed with medial branch blocks and radiofrequency ablation. She is using tramadol with an antiemetic that only takes a bit of the edge off of the pain.  I spent 25 minutes with this patient, greater than 50% was face-to-face time counseling regarding the above diagnoses.  ___________________________________________  Gwen Her. Dianah Field, M.D., ABFM., CAQSM. Primary Care and Sports Medicine [facility]  Adjunct Professor of [place] [employer] [facility] of Medicine

## 2018-07-23 NOTE — Patient Instructions (Signed)
Right-sided low back pain, she does have L3-L4 central canal stenosis with milder degenerative disc disease at other levels, did not respond to her right L3-L4 epidural, transforaminal approach. Next step will be to target the right L3-L4 and L4-L5 facets, these facets have the greatest degree of osteoarthritis. If this works then we will proceed with medial branch blocks and radiofrequency ablation. She is using tramadol with an antiemetic that only takes a bit of the edge off of the pain.

## 2018-07-23 NOTE — Assessment & Plan Note (Addendum)
Right-sided low back pain, she does have L3-L4 central canal stenosis with milder degenerative disc disease at other levels, did not respond to her right L3-L4 epidural, transforaminal approach. Next step will be to target the right L3-L4 and L4-L5 facets, these facets have the greatest degree of osteoarthritis. If this works then we will proceed with medial branch blocks and radiofrequency ablation. She is using tramadol with an antiemetic that only takes a bit of the edge off of the pain. 

## 2018-08-20 ENCOUNTER — Ambulatory Visit: Payer: BLUE CROSS/BLUE SHIELD | Admitting: Sports Medicine

## 2018-08-23 ENCOUNTER — Other Ambulatory Visit: Payer: Self-pay | Admitting: Sports Medicine

## 2018-12-15 ENCOUNTER — Emergency Department
Admission: EM | Admit: 2018-12-15 | Discharge: 2018-12-15 | Disposition: A | Discharge status: Home / Self Care | Payer: Self-pay | Source: Home / Self Care | Attending: Family Medicine | Admitting: Family Medicine

## 2018-12-15 ENCOUNTER — Other Ambulatory Visit: Payer: Self-pay

## 2018-12-15 DIAGNOSIS — R300 Dysuria: Secondary | ICD-10-CM

## 2018-12-15 LAB — POCT CBC W AUTO DIFF (K'VILLE URGENT CARE)

## 2018-12-15 MED ORDER — SULFAMETHOXAZOLE-TRIMETHOPRIM 800-160 MG PO TABS: 1.0000 | ORAL_TABLET | Freq: Two times a day (BID) | ORAL | 0 refills | Status: AC

## 2018-12-15 NOTE — ED Triage Notes (Signed)
Pt c/o dysuria for over a month. Has been on two different rounds of antibiotics. Saw a urologist. Not able to empty bladder last couple days. Drinking lots of fluids. Taking AZO and cranberry pills prn.

## 2018-12-15 NOTE — ED Provider Notes (Signed)
[facility]    CSN: [id number] Arrival date & time: 12/15/18  0913      History   Chief Complaint Chief Complaint  Patient presents with  . Dysuria  . Nephrolithiasis    HPI Brenda Church is a 48 y.o. female.   Patient complains of dysuria for about three months, and has taken two courses of antibiotics.  She was treated with Macrobid in September, and urine culture done 10/26/18 was negative.  Her dysuria has been worse during the past several days and she has difficulty emptying her bladder.  She denies abdominal pain and fevers, chills, and sweats.  She has a follow-up appointment with her PCP on 12/21/18. Review of records:  X-ray abdomen 10/26/18 ([facility]) showed bilateral non-obstructing renal calculi.  The history is provided by the patient.  Dysuria Pain quality:  Burning Pain severity:  Mild Onset quality:  Gradual Duration:  3 months Timing:  Intermittent Progression:  Worsening Chronicity:  Chronic Recent urinary tract infections: yes   Relieved by:  Nothing Worsened by:  Nothing Ineffective treatments:  Antibiotics and phenazopyridine Urinary symptoms: frequent urination and hesitancy   Urinary symptoms: no discolored urine, no foul-smelling urine, no hematuria and no bladder incontinence   Associated symptoms: no abdominal pain, no fever, no flank pain, no genital lesions, no nausea, no vaginal discharge and no vomiting   Risk factors: recurrent urinary tract infections     History reviewed. No pertinent past medical history.  Patient Active Problem List   Diagnosis Date Noted  . Lumbar spondylosis 03/10/2014    History reviewed. No pertinent surgical history.  OB History   No obstetric history on file.      Home Medications    Prior to Admission medications   Medication Sig Start Date End Date Taking? Authorizing Provider  baclofen (LIORESAL) 10 MG tablet Take by mouth. 12/10/18 12/10/19 Yes [provider]  celecoxib  (CELEBREX) 100 MG capsule Take by mouth. 10/19/18  Yes [provider]  DULoxetine (CYMBALTA) 60 MG capsule TAKE 1 CAPSULE BY MOUTH EVERY DAY 05/20/18  Yes [provider]  HYDROcodone-acetaminophen (NORCO/VICODIN) 5-325 MG tablet Take by mouth. 12/10/18 01/09/19 Yes [provider]  pregabalin (LYRICA) 25 MG capsule Take by mouth. 12/10/18  Yes [provider]  DULoxetine (CYMBALTA) 60 MG capsule TAKE 1 CAPSULE BY MOUTH EVERY DAY 05/20/18   Monica Becton, MD  meloxicam (MOBIC) 15 MG tablet Take by mouth.    [provider]  montelukast (SINGULAIR) 10 MG tablet Take 10 mg by mouth at bedtime.    [provider]  sulfamethoxazole-trimethoprim (BACTRIM DS) 800-160 MG tablet Take 1 tablet by mouth 2 (two) times daily. 12/15/18   Lattie Haw, MD  traMADol (ULTRAM) 50 MG tablet 1-2 TABS BY MOUTH EVERY 8 HOURS, MAXIMUM 6 TABS PER DAY. 08/24/18   Monica Becton, MD    Family History History reviewed. No pertinent family history.  Social History Social History   Tobacco Use  . Smoking status: Never Smoker  . Smokeless tobacco: Never Used  Substance Use Topics  . Alcohol use: Not on file  . Drug use: Not on file     Allergies   Vicodin [hydrocodone-acetaminophen]   Review of Systems Review of Systems  Constitutional: Negative for chills, diaphoresis, fatigue and fever.  Gastrointestinal: Negative for abdominal pain, nausea and vomiting.  Genitourinary: Positive for dysuria, frequency and urgency. Negative for flank pain, genital sores, hematuria, pelvic pain and vaginal discharge.  All  other systems reviewed and are negative.    Physical Exam Triage Vital Signs ED Triage Vitals  Enc Vitals Group     BP 12/15/18 0940 (!) 175/114     Pulse Rate 12/15/18 0940 75     Resp 12/15/18 0940 18     Temp 12/15/18 0940 98.5 F (36.9 C)     Temp Source 12/15/18 0940 Oral     SpO2 12/15/18 0940 96 %     Weight 12/15/18  0941 232 lb (105.2 kg)     Height 12/15/18 0941 5\' 7"  (1.702 m)     Head Circumference --      Peak Flow --      Pain Score 12/15/18 0941 5     Pain Loc --      Pain Edu? --      Excl. in [place]? --    No data found.  Updated Vital Signs BP (!) 165/102 (BP Location: Right Arm)   Pulse 75   Temp 98.5 F (36.9 C) (Oral)   Resp 18   Ht 5\' 7"  (1.702 m)   Wt 105.2 kg   LMP  (LMP Unknown)   SpO2 96%   BMI 36.34 kg/m   Visual Acuity Right Eye Distance:   Left Eye Distance:   Bilateral Distance:    Right Eye Near:   Left Eye Near:    Bilateral Near:     Physical Exam Nursing notes and Vital Signs reviewed. Appearance:  Patient appears stated age, and in no acute distress.    Eyes:  Pupils are equal, round, and reactive to light and accomodation.  Extraocular movement is intact.  Conjunctivae are not inflamed   Pharynx:  Normal; moist mucous membranes  Neck:  Supple.  No adenopathy Lungs:  Clear to auscultation.  Breath sounds are equal.  Moving air well. Heart:  Regular rate and rhythm without murmurs, rubs, or gallops.  Abdomen:  Nontender without masses or hepatosplenomegaly.  Bowel sounds are present.  No CVA or flank tenderness.  Extremities:  No edema.  Skin:  No rash present.     UC Treatments / Results  Labs (all labs ordered are listed, but only abnormal results are displayed) Labs Reviewed  URINE CULTURE  POCT CBC W AUTO DIFF ([place]):  WBC 10.8; LY 32.2; MO 5.0; GR 62.8; Hgb 14.1; Platelets 322     EKG   Radiology No results found.  Procedures Procedures (including critical care time)  Medications Ordered in UC Medications - No data to display  Initial Impression / Assessment and Plan / UC Course  I have reviewed the triage vital signs and the nursing notes.  Pertinent labs & imaging results that were available during my care of the patient were reviewed by me and considered in my medical decision making (see chart for details).     Note mild leukocytosis (WBC 10.8) Urine culture pending.  Begin Bactrim DS BID. Followup with Family Doctor as scheduled in about 6 days. Recommend evaluation by urologist   Final Clinical Impressions(s) / UC Diagnoses   Final diagnoses:  Dysuria     Discharge Instructions     Increase fluid intake. May use non-prescription AZO for about two days, if desired, to decrease urinary discomfort.   If symptoms become significantly worse during the night or over the weekend, proceed to the local emergency room.     ED Prescriptions    Medication Sig Dispense Auth. Provider   sulfamethoxazole-trimethoprim (BACTRIM DS) 800-160 MG  tablet Take 1 tablet by mouth 2 (two) times daily. 20 tablet Lattie HawBeese, Ennis Delpozo A, MD        Lattie HawBeese, Haston Casebolt A, MD 12/17/18 1058

## 2018-12-15 NOTE — Discharge Instructions (Addendum)
Increase fluid intake. May use non-prescription AZO for about two days, if desired, to decrease urinary discomfort.  If symptoms become significantly worse during the night or over the weekend, proceed to the local emergency room.  

## 2018-12-16 LAB — URINE CULTURE
MICRO NUMBER:: 1027885
Result:: NO GROWTH
SPECIMEN QUALITY:: ADEQUATE

## 2018-12-20 ENCOUNTER — Other Ambulatory Visit: Payer: Self-pay | Admitting: Sports Medicine

## 2018-12-20 DIAGNOSIS — M5416 Radiculopathy, lumbar region: Secondary | ICD-10-CM

## 2018-12-20 MED ORDER — DULOXETINE HCL 60 MG PO CPEP: ORAL_CAPSULE | ORAL | 1 refills | Status: AC

## 2020-12-06 IMAGING — MR MRI LUMBAR SPINE WITHOUT CONTRAST
4 of 5 series · 26 of 48 positions shown · non-contrast
Comparison: Plain films lumbar spine 04/06/2018 and 03/10/2014.

CLINICAL DATA: Low back pain for 3-5 years which has worsened over
the past 2 months. Bilateral lower extremity spasms. Right L4
radiculopathy. No known injury.

EXAM:
MRI LUMBAR SPINE WITHOUT CONTRAST
TECHNIQUE: Multiplanar, multisequence MR imaging of the lumbar spine was
performed. No intravenous contrast was administered.

[Series 6: T2 · sagittal · 4.0mm · 0.81mm/px · 6 of 15 slices shown (1 of 2)]
[im 1/15]
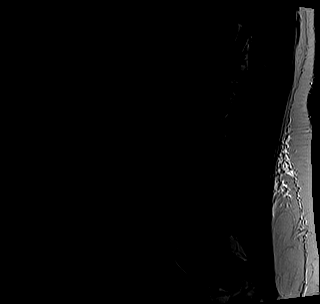
[im 3/15]
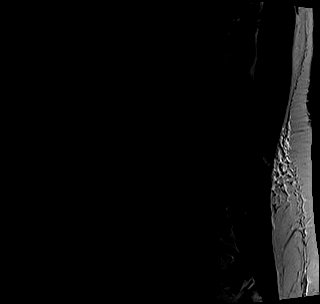
[im 6/15]
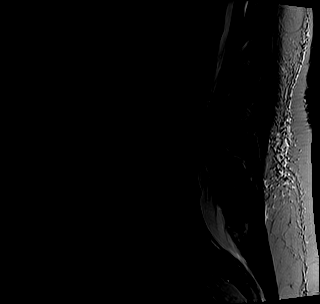
[im 9/15]
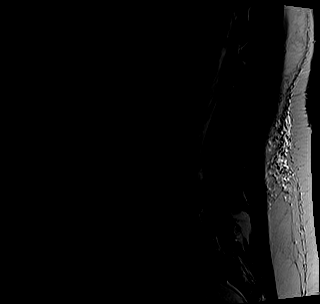
[im 12/15]
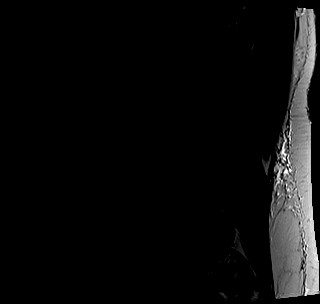
[im 15/15]
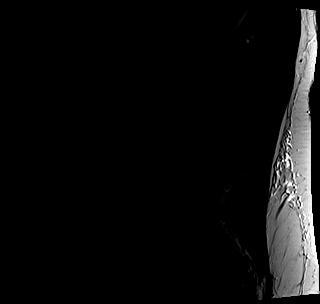

[Series 7: T1 · sagittal · 4.0mm · 0.41mm/px · 6 of 15 slices shown (1 of 2)]
[im 1/15]
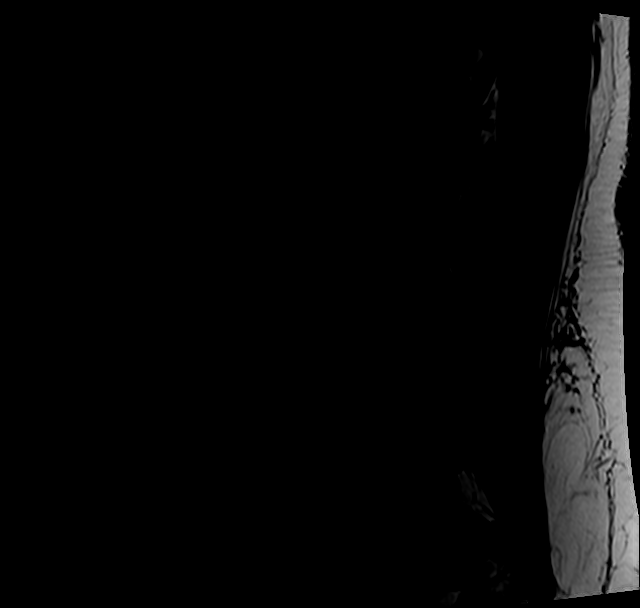
[im 3/15]
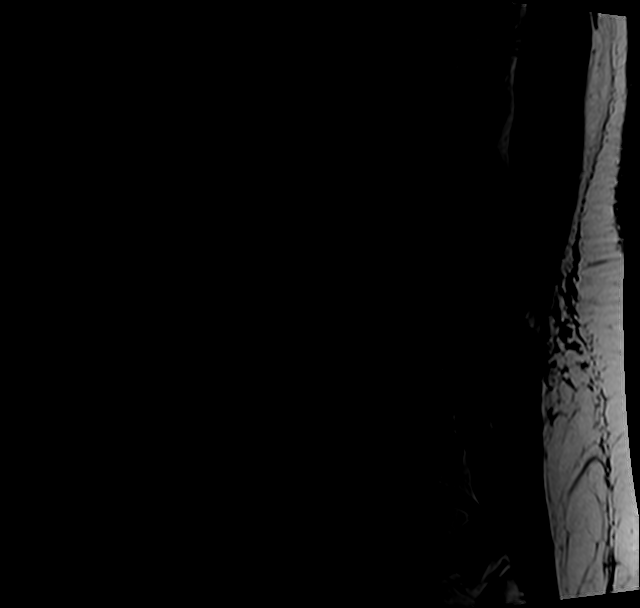
[im 6/15]
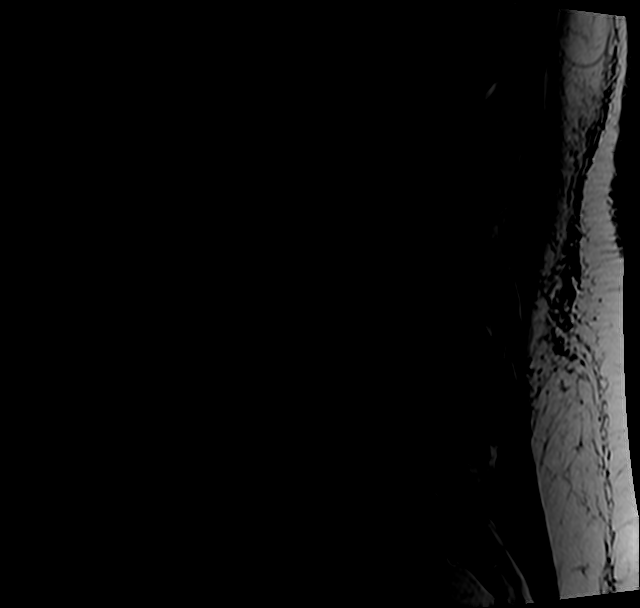
[im 9/15]
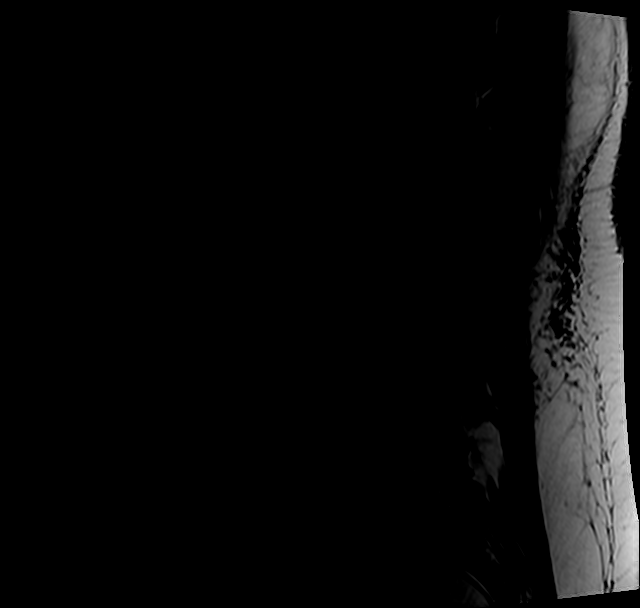
[im 12/15]
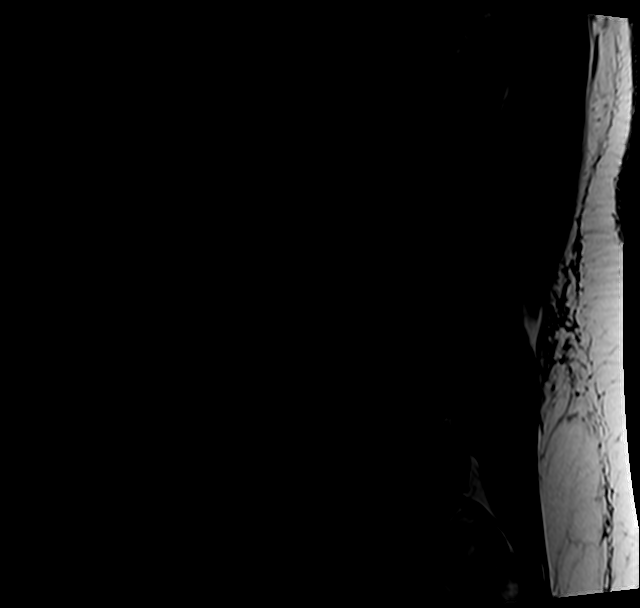
[im 15/15]
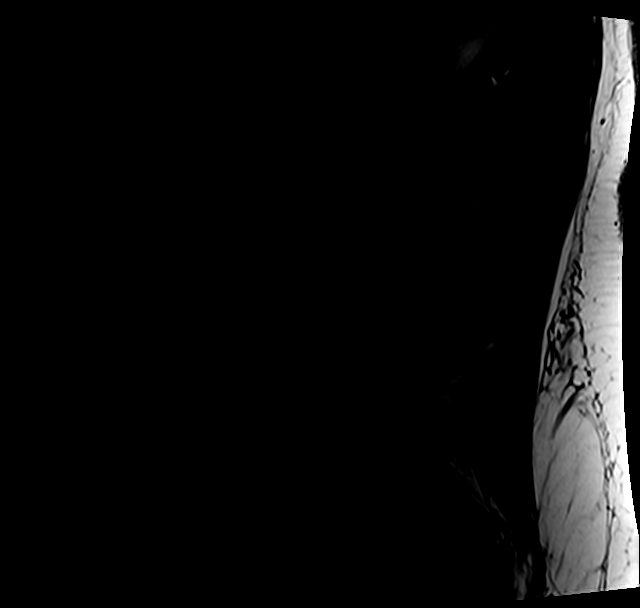

[Series 9: T2 · axial · 4.0mm · 0.78mm/px · z∈[-48,+172]mm · 9 of 35 slices shown (2 of 2)]
[im 1/35]
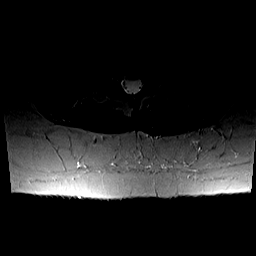
[im 5/35]
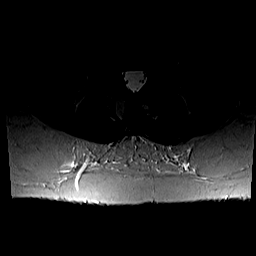
[im 10/35]
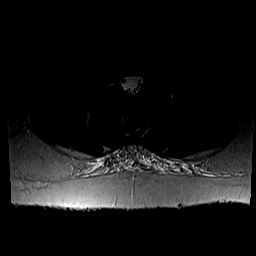
[im 15/35]
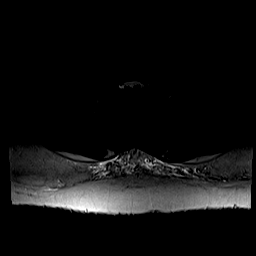
[im 18/35]
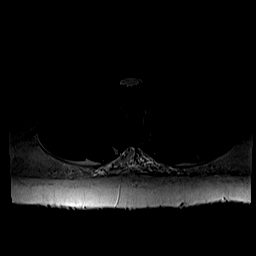
[im 20/35]
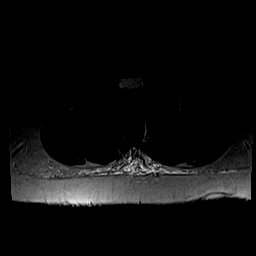
[im 25/35]
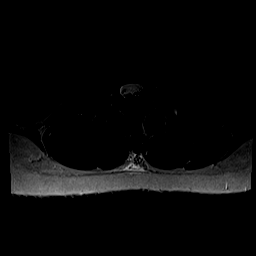
[im 30/35]
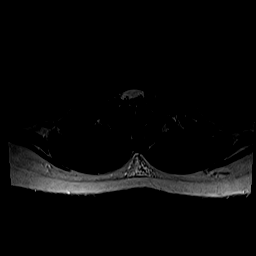
[im 35/35]
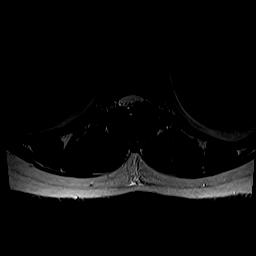

[Series 10: T1 · axial · 4.0mm · 0.39mm/px · z∈[-48,+121]mm · 5 of 35 slices shown (2 of 2)]
[im 1/35]
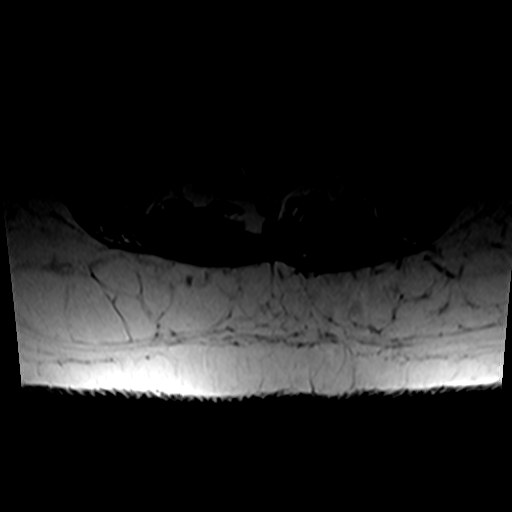
[im 5/35]
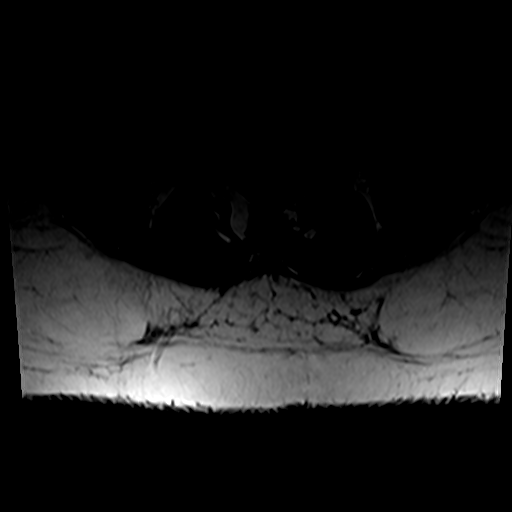
[im 10/35]
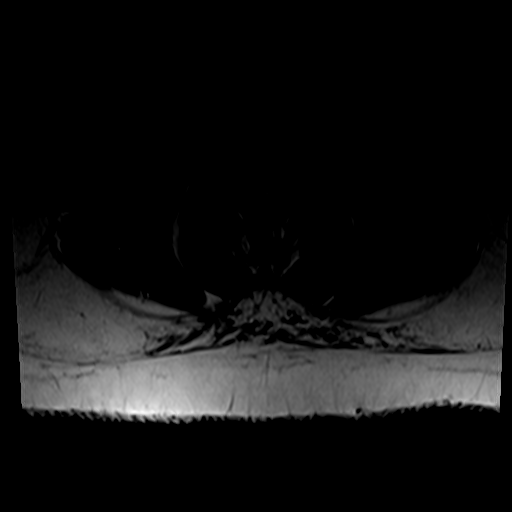
[im 18/35]
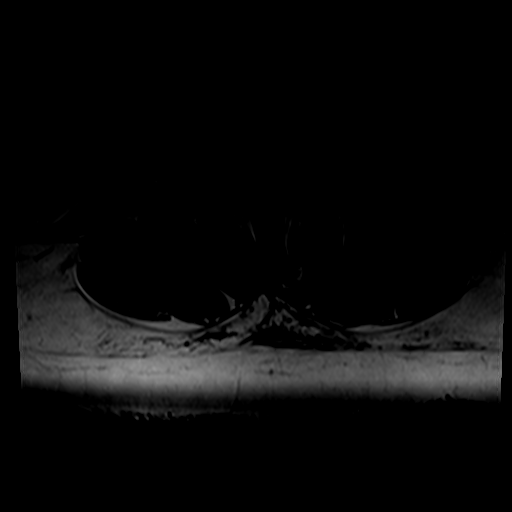
[im 30/35]
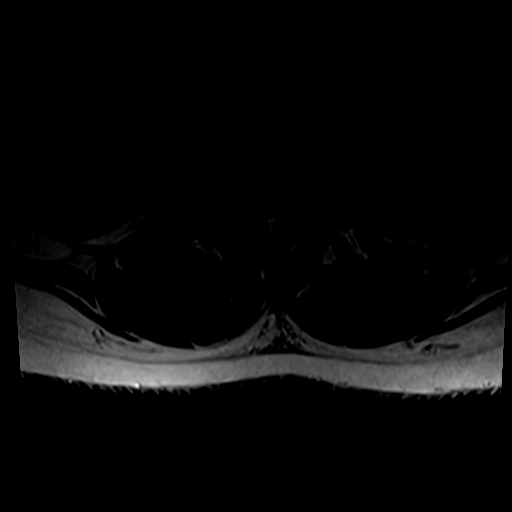

[26 of 48 positions shown; findings below may reference images not displayed]

FINDINGS: Segmentation:  Standard.

Alignment: 0.2 cm facet mediated anterolisthesis L3 on L4 and trace
retrolisthesis L4 on L5 are unchanged.

Vertebrae: No fracture or worrisome lesion. There is some
degenerative endplate signal change at L5-S1 eccentric to the right.

Conus medullaris and cauda equina: Conus extends to the T12 level.
Conus and cauda equina appear normal.

Paraspinal and other soft tissues: Negative.

Disc levels:

T12-L1: Negative.

L1-2: Negative.

L2-3: Shallow disc bulge without central canal narrowing. Mild
bilateral foraminal narrowing noted.

L3-4: Bilateral facet degenerative disease and ligamentum flavum
thickening. The disc is uncovered with a shallow bulge. There is
moderate to moderately severe central canal stenosis and narrowing
in the subarticular recesses, worse on the left. Mild to moderate
bilateral foraminal narrowing is present.

L4-5: There is some facet degenerative disease and a shallow disc
bulge. The central canal is open. Mild bilateral foraminal narrowing
is noted.

L5-S1: Mild bilateral facet degenerative disease. There is a shallow
disc bulge and annular fissure. The central canal and left foramen
are open. Right paravertebral disc and endplate spur contact the
exited right L5 root without compression or displacement.
IMPRESSION: Spondylosis appears worst at L3-4 where facet degenerative disease
results in 0.2 cm anterolisthesis. There is moderate to moderately
severe central canal stenosis at this level with left greater than
right subarticular recess narrowing.

Right paravertebral endplate spur at L5-S1 contacts the exited right
L5 root without compression or displacement.
